# Patient Record
Sex: Male | Born: 1995 | Race: Black or African American | Hispanic: No | Marital: Married | Smoking: Never smoker
Health system: Southern US, Community
[De-identification: ages and names within clinical notes are randomized; demographics above are authoritative.]

---
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
## 2022-01-22 ENCOUNTER — Ambulatory Visit (HOSPITAL_COMMUNITY)
Admission: EM | Admit: 2022-01-22 | Discharge: 2022-01-22 | Disposition: A | Discharge status: Home / Self Care | Payer: Medicaid Other | Attending: Clinical | Admitting: Clinical

## 2022-01-22 DIAGNOSIS — F419 Anxiety disorder, unspecified: Secondary | ICD-10-CM

## 2022-01-22 DIAGNOSIS — R45851 Suicidal ideations: Secondary | ICD-10-CM | POA: Diagnosis not present

## 2022-01-22 DIAGNOSIS — Z5900 Homelessness unspecified: Secondary | ICD-10-CM

## 2022-01-22 DIAGNOSIS — F909 Attention-deficit hyperactivity disorder, unspecified type: Secondary | ICD-10-CM | POA: Diagnosis not present

## 2022-01-22 DIAGNOSIS — F1220 Cannabis dependence, uncomplicated: Secondary | ICD-10-CM | POA: Insufficient documentation

## 2022-01-22 NOTE — ED Provider Notes (Signed)
Behavioral Health Urgent Care Medical Screening Exam  Patient Name: Jeremiah Robertson MRN: [id number] Date of Evaluation: 01/23/22 Chief Complaint:   Diagnosis:  Final diagnoses:  Homelessness unspecified  Suicidal thoughts  Anxiety disorder, unspecified type    History of Present illness: Jeremiah Robertson is a 27 y.o. male.  With a history of ADHD, as a child but currently not taking any medicines presented to [facility] accompanied by his mother.  According to the patient he was suicidal with plans to drown when asked how he was going to do it he did not know.  Per the patient his stressors include him losing an apartment back in October of last year.  According to patient he is currently homeless, patient is unemployed however patient stated he used to work at [employer] but did not have a ride to get to work.  Collaborate Per patient's mother patient is not receptive to help and patient is homeless and he could not stay with her because patient does not get along with her boyfriend.  According to mom patient has been sleeping at gas stations in the open and that is why she brought him here.  According to mom they have tried a couple shelters but all the shelters are full.  Face-to-face observation of patient, patient is alert and oriented x 4, speech is clear, maintaining eye contact.  Patient appearance is anxious, patient seems to get agitated really easily even when writer started to ask questions patient started to get agitated.  Patient stated he will not stay here for the record.  Patient stated he was suicidal but did not have a plan but then he said he would drown himself.  When asked how long this this been going on patient says since October of last year.  Patient denies HI, AVH or paranoia.  Patient reports he smokes marijuana daily, yesterday being the last day patient denies alcohol use or other illicit drug use.  Patient denies seeing a psychiatrist or therapist at this time according to patient he  will be calling his friends tomorrow so he can go and stay with them. Writer discussed with patient and mother the need for an admission however patient stated no he is not staying here.  Writer gave patient and mother time to talk with Clinical research associate went back to talk with patient and mother mother stated that patient will be staying with her for the night.  Mother stated she is responsible for patient and she will keep him safe.  Mother does denies any access to guns or weapons in the home.  Recommend discharge the patient to follow up either with outpatient services or walk-in psychiatry   Flowsheet Row ED from 01/22/2022 in [facility]  C-SSRS RISK CATEGORY High Risk       Psychiatric Specialty Exam  Presentation  General Appearance:Casual  Eye Contact:Fair  Speech:Clear and Coherent  Speech Volume:Normal  Handedness:Right   Mood and Affect  Mood: Anxious; Depressed  Affect: Constricted   Thought Process  Thought Processes: Coherent  Descriptions of Associations:Circumstantial  Orientation:Full (Time, Place and Person)  Thought Content:WDL  Diagnosis of Schizophrenia or Schizoaffective disorder in past: No   Hallucinations:None  Ideas of Reference:None  Suicidal Thoughts:Yes, Passive  Homicidal Thoughts:No   Sensorium  Memory: Immediate Fair  Judgment: Fair  Insight: Fair   Art therapist  Concentration: Fair  Attention Span: Fair  Recall: [employer] of Knowledge: Fair  Language: Fair   Psychomotor Activity  Psychomotor  Activity: Normal   Assets  Assets: Desire for Improvement; Housing   Sleep  Sleep: [place]  Number of hours:  5   Nutritional Assessment (For OBS and FBC admissions only) Has the patient had a weight loss or gain of 10 pounds or more in the last 3 months?: No Has the patient had a decrease in food intake/or appetite?: No Does the patient have dental problems?: No Does the  patient have eating habits or behaviors that may be indicators of an eating disorder including binging or inducing vomiting?: No Has the patient recently lost weight without trying?: 0 Has the patient been eating poorly because of a decreased appetite?: 0 Malnutrition Screening Tool Score: 0    Physical Exam: Physical Exam HENT:     Head: Normocephalic.     Nose: Nose normal.  Cardiovascular:     Rate and Rhythm: Normal rate.  Pulmonary:     Effort: Pulmonary effort is normal.  Musculoskeletal:        General: Normal range of motion.     Cervical back: Normal range of motion.  Neurological:     General: No focal deficit present.     Mental Status: He is alert.  Psychiatric:        Mood and Affect: Mood normal.        Behavior: Behavior normal.        Thought Content: Thought content normal.        Judgment: Judgment normal.    Review of Systems  Constitutional: Negative.   HENT: Negative.    Eyes: Negative.   Respiratory: Negative.    Cardiovascular: Negative.   Gastrointestinal: Negative.   Genitourinary: Negative.   Musculoskeletal: Negative.   Skin: Negative.   Neurological: Negative.   Endo/Heme/Allergies: Negative.   Psychiatric/Behavioral:  Positive for suicidal ideas. The patient is nervous/anxious.    Blood pressure (!) 142/93, pulse 61, temperature 98.2 F (36.8 C), temperature source Oral, resp. rate 18, SpO2 100 %. There is no height or weight on file to calculate BMI.  Musculoskeletal: Strength & Muscle Tone: within normal limits Gait & Station: normal Patient leans: N/A   [place] MSE Discharge Disposition for Follow up and Recommendations: Based on my evaluation the patient does not appear to have an emergency medical condition and can be discharged with resources and follow up care in outpatient services for Individual Therapy   Evette Georges, NP 01/23/2022, 5:34 AM

## 2022-01-22 NOTE — Progress Notes (Signed)
   01/22/22 1957  [facility] Triage Screening (Walk-ins at [facility] only)  How Did You Hear About [place]? Family/Friend  What Is the Reason for Your Visit/Call Today? Presents to [facility] voluntarily, accompanied by his mother Roby Lofts due to depression and suicidal ideation with a plan of drowning. Pt's mother reports Pt has made statements such as "I don't want to be here anymore," in the previous days and today. Pt reports he has been experiencing depression since October, however, later admits it has been longer.  Pt was diagnosed with depression and anxiety at age 71. Pt has never been prescribed medication for his diagnosis; however, he did receive therapy a few months following his initial diagnosis. Pt has been homeless since December 3 and he is not receiving any mental health services. Pt has never been hospitalized. Pt regularly smokes marijuana and states he last smoked yesterday. Pt is unable to contract for safety at this time and his mother does not believe he would be safe if left alone. Pt denies HI, AH, or VH.  How Long Has This Been Causing You Problems? 1-6 months  Have You Recently Had Any Thoughts About Hurting Yourself? Yes  How long ago did you have thoughts about hurting yourself? Today  Are You Planning to Commit Suicide/Harm Yourself At This time? Yes  Have you Recently Had Thoughts About Hurting Someone Guadalupe Dawn? No  Are You Planning To Harm Someone At This Time? No  Are you currently experiencing any auditory, visual or other hallucinations? No  Have You Used Any Alcohol or Drugs in the Past 24 Hours? Yes  How long ago did you use Drugs or Alcohol? Pt reports using marijuana yesterday.  What Did You Use and How Much? Pt stated he smoked twice.  Do you have any current medical co-morbidities that require immediate attention? No  Clinician description of patient physical appearance/behavior: Pt is dressed in jeans and a jacket. Pt has a flat affect and speaks softly. There is no indication that  patient is responding to internal stimuli.  What Do You Feel Would Help You the Most Today? Treatment for Depression or other mood problem;Housing Assistance  If access to [facility] was not available, would you have sought care in the Emergency Department? Yes  Determination of Need Emergent (2 hours)  Options For Referral Medication Management;Inpatient Hospitalization;Outpatient Therapy

## 2022-01-22 NOTE — BH Assessment (Signed)
Comprehensive Clinical Assessment (CCA) Note  01/22/2022 NEHEMYAH FOUSHEE [id number]  Disposition: Per Sindy Guadeloupe, NP patient recommended for inpatient admission, however patient refused.   Patient's mother informed by Sindy Guadeloupe, NP that she can IVC patient to ensure safety. Patient's mother stated she will be responsible for patient and keep him safe upon discharge. Patient has no access to guns or weapons at her home. Patient provided with outpatient resources, as well as 24/7 crisis information.  The patient demonstrates the following risk factors for suicide: Chronic risk factors for suicide include: N/A. Acute risk factors for suicide include: unemployment and loss (financial, interpersonal, professional). Protective factors for this patient include: positive social support. Considering these factors, the overall suicide risk at this point appears to be high. Patient is not appropriate for outpatient follow up.  Stephone Gum is a 27 y.o. single male who presents voluntarily to [facility] and accompanied by his mother Nigel Berthold [phone], who acts as collateral with patient's permission. Patient reports he has been experiencing depression since at least October. Patient's mother states patient has been making statements such as "I don't want to be here anymore" today and previous days. Patient reports he is currently feeling suicidal with a plan of drowning. Patient acknowledges symptoms including isolation, lack of motivation, lack of appetite and no sleep. Pt denies any HI, auditory or visual hallucinations. Per patient's mother, he was diagnosed with anxiety and depression when he was 27 years old. Patient saw a therapist with [facility] for a few months following the diagnosis and has had no additional mental health services since that time. Pt states he smokes marijuana daily, with his last use being yesterday. Patient denies any additional substance use. Patient  denies access to guns or other weapons.  Patient identifies his primary stressor as being homelessness. Patient states he has been homeless since December 18, 2022. Patient's mother reports patient had no motivation to get out of bed due to his depression, which was a contributing factor to losing his apartment. His mother also reports patient recently broke up with his girlfriend. Additionally, patient states having his immediate family and grandmother move away in 2016 was a trigger for his depression diagnosis. Patient identifies his mother as his only support. Patient denies any history of abuse or trauma. Pt denies any current legal involvement.  Aside from therapy at age 38, patient has never received mental health services or been hospitalized. Patient has no medical concerns and is not prescribed any medications.   Patient presents dressed casually, alert and oriented x4. Patient has low, but normal speech. Patient has a flat affect and has to be directly engaged and prompted to answer questions. Most of patient's history is provided by his mother.  Patient's eye contact is good and he becomes tearful when discussing his family moving away. There is no indication patient is responding to internal stimuli. Patient's mother does not believe patient will be safe if left alone and patient is unable to contract for safety at this time.  Chief Complaint:  Chief Complaint  Patient presents with   Suicidal   Visit Diagnosis:   Major Depressive Disorder   CCA Screening, Triage and Referral (STR)  Patient Reported Information How did you hear about [place]? Family/Friend  What Is the Reason for Your Visit/Call Today? Presents to [facility] voluntarily, accompanied by his mother Lucinda Dell due to depression and suicidal ideation with a plan of drowning. Pt's mother reports Pt has made statements such as "  I don't want to be here anymore," in the previous days and today. Pt reports he has been experiencing  depression since October, however, later admits it has been longer. Pt was diagnosed with depression and anxiety at age 60. Pt has never been prescribed medication for his diagnosis; however, he did receive therapy a few months following his initial diagnosis. Pt has been homeless since December 3 and he is not receiving any mental health services. Pt has never been hospitalized. Pt regularly smokes marijuana and states he last smoked yesterday. Pt is unable to contract for safety at this time and his mother does not believe he would be safe if left alone. Pt denies HI, AH, or VH.  How Long Has This Been Causing You Problems? 1-6 months  What Do You Feel Would Help You the Most Today? Treatment for Depression or other mood problem; Housing Assistance   Have You Recently Had Any Thoughts About Hurting Yourself? Yes  Are You Planning to Commit Suicide/Harm Yourself At This time? Yes   Flowsheet Row ED from 01/22/2022 in [facility]  C-SSRS RISK CATEGORY High Risk       Have you Recently Had Thoughts About Hurting Someone Karolee Ohs? No  Are You Planning to Harm Someone at This Time? No  Explanation: N/A   Have You Used Any Alcohol or Drugs in the Past 24 Hours? Yes  What Did You Use and How Much? Pt stated he smoked twice.   Do You Currently Have a Therapist/Psychiatrist? No  Name of Therapist/Psychiatrist: Name of Therapist/Psychiatrist: N/A   Have You Been Recently Discharged From Any Office Practice or Programs? No  Explanation of Discharge From Practice/Program: N/A     CCA Screening Triage Referral Assessment Type of Contact: Face-to-Face  Telemedicine Service Delivery:   Is this Initial or Reassessment?   Date Telepsych consult ordered in [facility]:    Time Telepsych consult ordered in [facility]:    Location of Assessment: [facility]  Provider Location: [facility]   Collateral Involvement: Nigel Berthold  (mother)   Does Patient Have a Automotive engineer Guardian? No  Legal Guardian Contact Information: N/A  Copy of Legal Guardianship Form: -- (N/A)  Legal Guardian Notified of Arrival: -- (N/A)  Legal Guardian Notified of Pending Discharge: -- (N/A)  If Minor and Not Living with Parent(s), Who has Custody? N/A  Is CPS involved or ever been involved? Never  Is APS involved or ever been involved? Never   Patient Determined To Be At Risk for Harm To Self or Others Based on Review of Patient Reported Information or Presenting Complaint? Yes, for Self-Harm  Method: Plan with intent and identified person (Plan with intent on self to drown.)  Availability of Means: No access or NA  Intent: Clearly intends on inflicting harm that could cause death  Notification Required: No need or identified person  Additional Information for Danger to Others Potential: -- (N/A)  Additional Comments for Danger to Others Potential: N/A  Are There Guns or Other Weapons in Your Home? No  Types of Guns/Weapons: N/A  Are These Weapons Safely Secured?                            -- (N/A)  Who Could Verify You Are Able To Have These Secured: N/A  Do You Have any Outstanding Charges, Pending Court Dates, Parole/Probation? None  Contacted To Inform of Risk of Harm  To Self or Others: -- (N/A)    Does Patient Present under Involuntary Commitment? No    [place] of Residence: [place]   Patient Currently Receiving the Following Services: Not Receiving Services   Determination of Need: Emergent (2 hours)   Options For Referral: Medication Management; Inpatient Hospitalization     CCA Biopsychosocial Patient Reported Schizophrenia/Schizoaffective Diagnosis in Past: No   Strengths: Pt's mother states patient is resilent.   Mental Health Symptoms Depression:   Change in energy/activity; Hopelessness; Sleep (too much or little); Weight gain/loss; Increase/decrease in appetite;  Worthlessness; Tearfulness   Duration of Depressive symptoms:  Duration of Depressive Symptoms: Greater than two weeks   Mania:   None   Anxiety:    None   Psychosis:   None   Duration of Psychotic symptoms:    Trauma:   None   Obsessions:   None   Compulsions:   None   Inattention:   None   Hyperactivity/Impulsivity:   None   Oppositional/Defiant Behaviors:   None   Emotional Irregularity:   None   Other Mood/Personality Symptoms:   N/A    Mental Status Exam Appearance and self-care  Stature:   Tall   Weight:   Average weight   Clothing:   Casual   Grooming:   Normal   Cosmetic use:   None   Posture/gait:   Normal   Motor activity:   Not Remarkable   Sensorium  Attention:   Normal   Concentration:   Normal   Orientation:   X5   Recall/memory:   Normal   Affect and Mood  Affect:   Flat; Depressed   Mood:   Depressed   Relating  Eye contact:   Normal   Facial expression:   Depressed   Attitude toward examiner:   Cooperative   Thought and Language  Speech flow:  Normal   Thought content:   Appropriate to Mood and Circumstances   Preoccupation:   None   Hallucinations:   None   Organization:   Linear   Company secretary of Knowledge:   Average   Intelligence:   Average   Abstraction:   Normal   Judgement:   Normal   Reality Testing:   Realistic   Insight:   Good   Decision Making:   Normal   Social Functioning  Social Maturity:   Isolates   Social Judgement:   Normal   Stress  Stressors:   Housing; Transitions   Coping Ability:   Deficient supports; Overwhelmed   Skill Deficits:   Responsibility   Supports:   Support needed; Family     Religion: Religion/Spirituality Are You A Religious Person?: No How Might This Affect Treatment?: N/A  Leisure/Recreation: Leisure / Recreation Do You Have Hobbies?: Yes Leisure and Hobbies: Reading and  sports  Exercise/Diet: Exercise/Diet Do You Exercise?: No Have You Gained or Lost A Significant Amount of Weight in the Past Six Months?: Yes-Lost Number of Pounds Lost?:  (Unknown amount) Do You Follow a Special Diet?: No Do You Have Any Trouble Sleeping?: Yes Explanation of Sleeping Difficulties: Pt states he has not been sleeping lately   CCA Employment/Education Employment/Work Situation: Employment / Work Situation Employment Situation: Unemployed Patient's Job has Been Impacted by Current Illness: Yes Describe how Patient's Job has Been Impacted: Pt's mother reports patient had no motivation to get out of bed, which led to patient losing his apartment. Has Patient ever Been in the [employer]?: No  Education: Education  Is Patient Currently Attending School?: No Last Grade Completed: 9 Did You Attend College?: No Did You Have An Individualized Education Program (IIEP): No Did You Have Any Difficulty At School?: No Patient's Education Has Been Impacted by Current Illness: No   CCA Family/Childhood History Family and Relationship History: Family history Marital status: Single Does patient have children?: No  Childhood History:  Childhood History By whom was/is the patient raised?: Mother Did patient suffer any verbal/emotional/physical/sexual abuse as a child?: No Did patient suffer from severe childhood neglect?: No Has patient ever been sexually abused/assaulted/raped as an adolescent or adult?: No Was the patient ever a victim of a crime or a disaster?: No Witnessed domestic violence?: No Has patient been affected by domestic violence as an adult?: No       CCA Substance Use Alcohol/Drug Use: Alcohol / Drug Use Pain Medications: See MAR Prescriptions: See MAR Over the Counter: See MAR History of alcohol / drug use?:  (N/A) Longest period of sobriety (when/how long): N/A Negative Consequences of Use:  (N/A) Withdrawal Symptoms:  (N/A)                          ASAM's:  Six Dimensions of Multidimensional Assessment  Dimension 1:  Acute Intoxication and/or Withdrawal Potential:      Dimension 2:  Biomedical Conditions and Complications:      Dimension 3:  Emotional, Behavioral, or Cognitive Conditions and Complications:     Dimension 4:  Readiness to Change:     Dimension 5:  Relapse, Continued use, or Continued Problem Potential:     Dimension 6:  Recovery/Living Environment:     ASAM Severity Score:    ASAM Recommended Level of Treatment:     Substance use Disorder (SUD)    Recommendations for Services/Supports/Treatments:    Discharge Disposition:    DSM5 Diagnoses: There are no problems to display for this patient.    Referrals to Alternative Service(s): Referred to Alternative Service(s):   Place:   Date:   Time:    Referred to Alternative Service(s):   Place:   Date:   Time:    Referred to Alternative Service(s):   Place:   Date:   Time:    Referred to Alternative Service(s):   Place:   Date:   Time:     Waylan Boga, Latanya Presser

## 2022-01-22 NOTE — ED Notes (Signed)
Pt was given d/c instructions and belongings given to them out of red locker. Pt and family member was escorted into lobby

## 2022-01-22 NOTE — Discharge Instructions (Signed)
F/u with walk-in Psychiatry  [facility] with [facility]
